# Patient Record
Sex: Female | Born: 2000 | Race: Black or African American | Hispanic: No | Marital: Single | Smoking: Never smoker
Health system: Southern US, Community
[De-identification: ages and names within clinical notes are randomized; demographics above are authoritative.]

---

## 2019-06-28 ENCOUNTER — Encounter (HOSPITAL_COMMUNITY): Payer: Self-pay | Admitting: Emergency Medicine

## 2019-06-28 ENCOUNTER — Other Ambulatory Visit: Payer: Self-pay

## 2019-06-28 ENCOUNTER — Inpatient Hospital Stay (HOSPITAL_COMMUNITY)
Admission: AD | Admit: 2019-06-28 | Payer: No Typology Code available for payment source | Source: Intra-hospital | Admitting: Psychiatry

## 2019-06-28 ENCOUNTER — Emergency Department (HOSPITAL_COMMUNITY)
Admission: EM | Admit: 2019-06-28 | Discharge: 2019-06-29 | Disposition: A | Discharge status: Other Acute Inpatient Hospital | Payer: No Typology Code available for payment source | Attending: Emergency Medicine | Admitting: Emergency Medicine

## 2019-06-28 DIAGNOSIS — Z20822 Contact with and (suspected) exposure to covid-19: Secondary | ICD-10-CM | POA: Insufficient documentation

## 2019-06-28 DIAGNOSIS — Z046 Encounter for general psychiatric examination, requested by authority: Secondary | ICD-10-CM

## 2019-06-28 DIAGNOSIS — F23 Brief psychotic disorder: Secondary | ICD-10-CM | POA: Insufficient documentation

## 2019-06-28 LAB — COMPREHENSIVE METABOLIC PANEL
ALT: 13 U/L (ref 0–44)
AST: 17 U/L (ref 15–41)
Albumin: 4.5 g/dL (ref 3.5–5.0)
Alkaline Phosphatase: 49 U/L (ref 38–126)
Anion gap: 10 (ref 5–15)
BUN: 7 mg/dL (ref 6–20)
CO2: 22 mmol/L (ref 22–32)
Calcium: 9.6 mg/dL (ref 8.9–10.3)
Chloride: 105 mmol/L (ref 98–111)
Creatinine, Ser: 0.62 mg/dL (ref 0.44–1.00)
GFR calc Af Amer: >60 mL/min (ref >60)
GFR calc non Af Amer: >60 mL/min (ref >60)
Glucose, Bld: 103 mg/dL — ABNORMAL HIGH (ref 70–99)
Potassium: 4.4 mmol/L (ref 3.5–5.1)
Sodium: 137 mmol/L (ref 135–145)
Total Bilirubin: 0.5 mg/dL (ref 0.3–1.2)
Total Protein: 8.3 g/dL — ABNORMAL HIGH (ref 6.5–8.1)

## 2019-06-28 LAB — I-STAT BETA HCG BLOOD, ED (MC, WL, AP ONLY): I-stat hCG, quantitative: <5 m[IU]/mL (ref <5)

## 2019-06-28 LAB — CBC
HCT: 39.6 % (ref 36.0–46.0)
Hemoglobin: 13.1 g/dL (ref 12.0–15.0)
MCH: 28.4 pg (ref 26.0–34.0)
MCHC: 33.1 g/dL (ref 30.0–36.0)
MCV: 85.7 fL (ref 80.0–100.0)
Platelets: 307 10*3/uL (ref 150–400)
RBC: 4.62 MIL/uL (ref 3.87–5.11)
RDW: 12.8 % (ref 11.5–15.5)
WBC: 8.8 10*3/uL (ref 4.0–10.5)
nRBC: 0 % (ref 0.0–0.2)

## 2019-06-28 LAB — RAPID URINE DRUG SCREEN, HOSP PERFORMED
Amphetamines: NOT DETECTED
Barbiturates: NOT DETECTED
Benzodiazepines: NOT DETECTED
Cocaine: NOT DETECTED
Opiates: NOT DETECTED
Tetrahydrocannabinol: POSITIVE — AB

## 2019-06-28 LAB — SARS CORONAVIRUS 2 BY RT PCR (HOSPITAL ORDER, PERFORMED IN ~~LOC~~ HOSPITAL LAB): SARS Coronavirus 2: NEGATIVE

## 2019-06-28 LAB — ETHANOL: Alcohol, Ethyl (B): <10 mg/dL (ref <10)

## 2019-06-28 MED ORDER — DIPHENHYDRAMINE HCL 50 MG/ML IJ SOLN: 50.0000 mg | Freq: Once | INTRAMUSCULAR | Status: DC

## 2019-06-28 MED ORDER — DIPHENHYDRAMINE HCL 50 MG/ML IJ SOLN: 50.0000 mg | Freq: Once | INTRAMUSCULAR | Status: DC | PRN

## 2019-06-28 MED ORDER — ZOLPIDEM TARTRATE 5 MG PO TABS: 5.0000 mg | ORAL_TABLET | Freq: Every evening | ORAL | Status: DC | PRN

## 2019-06-28 MED ORDER — ONDANSETRON HCL 4 MG PO TABS: 4.0000 mg | ORAL_TABLET | Freq: Three times a day (TID) | ORAL | Status: DC | PRN

## 2019-06-28 MED ORDER — ACETAMINOPHEN 325 MG PO TABS: 650.0000 mg | ORAL_TABLET | ORAL | Status: DC | PRN

## 2019-06-28 MED ORDER — OLANZAPINE 10 MG IM SOLR
10.0000 mg | Freq: Once | INTRAMUSCULAR | Status: AC | PRN
  Administered 2019-06-29: 10 mg via INTRAMUSCULAR
  Filled 2019-06-28: qty 10

## 2019-06-28 MED ORDER — ALUM & MAG HYDROXIDE-SIMETH 200-200-20 MG/5ML PO SUSP: 30.0000 mL | Freq: Four times a day (QID) | ORAL | Status: DC | PRN

## 2019-06-28 NOTE — ED Notes (Signed)
PT'S ID IS IN HER BELONGING'S BAG THAT IS IN LOCK[address].  HER SISTER TOOK HER TELEPHONE WHEN SHE LEFT.
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
## 2019-06-28 NOTE — ED Notes (Signed)
Pt's sister has dropped her off here and left with pt's telephone so we do not have any telephone numbers for family.  The number that we have for her mother is not being answered.  Pt is agitated about her circumstance but does redirect.
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
## 2019-06-28 NOTE — ED Provider Notes (Signed)
[facility]-EMERGENCY DEPT Provider Note   CSN: [id number] Arrival date & time: 06/28/19  1135     History Chief Complaint  Patient presents with  . Hallucinations    Olivia Juarez is a 19 y.o. female presents brought in by her sister for evaluation of increasing paranoia.  When asked what brought her into the emergency department patient states "my sister because she thinks I am crazy.  But she does not know that her phones are tapped and the voices that I am hearing are coming through the phone".  She states that she has had similar experiences when living in [facility].  She denies suicidal ideation or homicidal ideation.  She states that she smokes marijuana "once in a blue moon", denies other recreational drug use or cigarette smoking or alcohol use.  She denies suicidal ideation or homicidal ideation.  She denies any medical complaints otherwise.  The history is provided by the patient.       History reviewed. No pertinent past medical history.  There are no problems to display for this patient.   History reviewed. No pertinent surgical history.   OB History   No obstetric history on file.     No family history on file.  Social History   Tobacco Use  . Smoking status: Never Smoker  . Smokeless tobacco: Never Used  Substance Use Topics  . Alcohol use: Never  . Drug use: Never    Home Medications Prior to Admission medications   Not on File    Allergies    Patient has no known allergies.  Review of Systems   Review of Systems  Constitutional: Negative for chills and fever.  Psychiatric/Behavioral: Positive for hallucinations. The patient is nervous/anxious.   All other systems reviewed and are negative.   Physical Exam Updated Vital Signs BP (!) 154/102 (BP Location: Left Arm)   Pulse 85   Temp 98.3 F (36.8 C) (Oral)   Resp 18   Ht 5\' 2"  (1.575 m)   Wt 69.4 kg   SpO2 100%   BMI 27.98 kg/m   Physical  Exam Vitals and nursing note reviewed.  Constitutional:      General: She is not in acute distress.    Appearance: She is well-developed.  HENT:     Head: Normocephalic and atraumatic.  Eyes:     General:        Right eye: No discharge.        Left eye: No discharge.     Conjunctiva/sclera: Conjunctivae normal.  Neck:     Vascular: No JVD.     Trachea: No tracheal deviation.  Cardiovascular:     Rate and Rhythm: Normal rate and regular rhythm.  Pulmonary:     Effort: Pulmonary effort is normal.     Breath sounds: Normal breath sounds.  Abdominal:     General: Bowel sounds are normal. There is no distension.     Palpations: Abdomen is soft.     Tenderness: There is no abdominal tenderness. There is no guarding or rebound.  Skin:    General: Skin is warm and dry.     Findings: No erythema.  Neurological:     Mental Status: She is alert.  Psychiatric:        Mood and Affect: Mood is anxious.        Behavior: Behavior is agitated. Behavior is cooperative.        Thought Content: Thought content is paranoid. Thought  content does not include homicidal or suicidal ideation. Thought content does not include homicidal or suicidal plan.     ED Results / Procedures / Treatments   Labs (all labs ordered are listed, but only abnormal results are displayed) Labs Reviewed  COMPREHENSIVE METABOLIC PANEL - Abnormal; Notable for the following components:      Result Value   Glucose, Bld 103 (*)    Total Protein 8.3 (*)    All other components within normal limits  RAPID URINE DRUG SCREEN, HOSP PERFORMED - Abnormal; Notable for the following components:   Tetrahydrocannabinol POSITIVE (*)    All other components within normal limits  SARS CORONAVIRUS 2 BY RT PCR (HOSPITAL ORDER, [place] [facility])  ETHANOL  CBC  I-STAT BETA HCG BLOOD, ED ([facility], [facility], [facility] ONLY)    EKG None  Radiology No results found.  Procedures Procedures (including critical care time)   Medications Ordered in ED Medications  acetaminophen (TYLENOL) tablet 650 mg (has no administration in time range)  ondansetron (ZOFRAN) tablet 4 mg (has no administration in time range)  alum & mag hydroxide-simeth (MAALOX/MYLANTA) 200-200-20 MG/5ML suspension 30 mL (has no administration in time range)  OLANZapine (ZYPREXA) injection 10 mg (has no administration in time range)  diphenhydrAMINE (BENADRYL) injection 50 mg (50 mg Intramuscular Refused 06/28/19 1833)  diphenhydrAMINE (BENADRYL) injection 50 mg (has no administration in time range)    ED Course  I have reviewed the triage vital signs and the nursing notes.  Pertinent labs & imaging results that were available during my care of the patient were reviewed by me and considered in my medical decision making (see chart for details).    MDM Rules/Calculators/A&P                      Patient presenting brought in by sister for evaluation of paranoia and delusions.  She is concerned that someone has bugged her telephone and was following her.  She denies suicidal ideation or homicidal ideation.  She has reportedly been talking to herself.  She is afebrile, initially tachycardic and mildly hypertensive with improvement on subsequent reevaluations.  Physical examination is reassuring and screening labs reviewed and interpreted by myself show no concerning findings.  Her UDS is positive for THC and she admits to smoking marijuana occasionally.  She is medically cleared for TTS evaluation at this time.  TTS recommends inpatient admission.  Patient has attempted to leave twice now.  She will need to be placed under involuntary commitment due to concern that she is a danger to herself at this time.  Final Clinical Impression(s) / ED Diagnoses Final diagnoses:  Brief psychotic disorder [facility])  Involuntary commitment    Rx / DC Orders ED Discharge Orders    None       Debroah Baller 06/28/19 2056    Varney Biles, MD  06/29/19 [phone]

## 2019-06-28 NOTE — ED Notes (Signed)
Pt's mother's name and telephone number (according to pt)  Is Sabrina at [phone].  Pt was in her room talking to herself in such a robust manner that this writer thought that she was on the telephone with her mother.

## 2019-06-28 NOTE — ED Triage Notes (Signed)
Patient here from home brought in by sister complaining of increase paranoia. Patient does not have a hx and is not on any meds. Patient recently begin thinking that neighbors were following her around and talking to her through the vents. Also believes neighbors have gained control for her phone and are monitoring her through it.

## 2019-06-28 NOTE — ED Notes (Signed)
Pt dressed in hospital scrubs and wanded by security. Allowed to keep her hair on which is attached with a head band. Pt denies suicide.  Report is that she is delusional. Pt said, "Just because I said that my phone is tapped.  It is tapped!!"  When asked who is tapping it she said, "I'm tired of answering these questions.

## 2019-06-29 ENCOUNTER — Encounter (HOSPITAL_COMMUNITY): Payer: Self-pay | Admitting: Psychiatry

## 2019-06-29 ENCOUNTER — Other Ambulatory Visit: Payer: Self-pay

## 2019-06-29 ENCOUNTER — Encounter (HOSPITAL_COMMUNITY): Payer: Self-pay | Admitting: Registered Nurse

## 2019-06-29 ENCOUNTER — Emergency Department (HOSPITAL_COMMUNITY): Payer: No Typology Code available for payment source

## 2019-06-29 ENCOUNTER — Inpatient Hospital Stay (HOSPITAL_COMMUNITY)
Admission: AD | Admit: 2019-06-29 | Discharge: 2019-07-02 | DRG: 885 | Disposition: A | Discharge status: Home / Self Care | Payer: No Typology Code available for payment source | Source: Intra-hospital | Attending: Psychiatry | Admitting: Psychiatry

## 2019-06-29 DIAGNOSIS — Z91040 Latex allergy status: Secondary | ICD-10-CM | POA: Diagnosis not present

## 2019-06-29 DIAGNOSIS — F909 Attention-deficit hyperactivity disorder, unspecified type: Secondary | ICD-10-CM | POA: Diagnosis present

## 2019-06-29 DIAGNOSIS — K649 Unspecified hemorrhoids: Secondary | ICD-10-CM | POA: Diagnosis present

## 2019-06-29 DIAGNOSIS — G4700 Insomnia, unspecified: Secondary | ICD-10-CM | POA: Diagnosis present

## 2019-06-29 DIAGNOSIS — F1210 Cannabis abuse, uncomplicated: Secondary | ICD-10-CM | POA: Diagnosis present

## 2019-06-29 DIAGNOSIS — B379 Candidiasis, unspecified: Secondary | ICD-10-CM | POA: Diagnosis present

## 2019-06-29 DIAGNOSIS — K5900 Constipation, unspecified: Secondary | ICD-10-CM | POA: Diagnosis present

## 2019-06-29 DIAGNOSIS — F23 Brief psychotic disorder: Secondary | ICD-10-CM | POA: Diagnosis present

## 2019-06-29 DIAGNOSIS — F29 Unspecified psychosis not due to a substance or known physiological condition: Secondary | ICD-10-CM | POA: Diagnosis present

## 2019-06-29 DIAGNOSIS — F419 Anxiety disorder, unspecified: Secondary | ICD-10-CM | POA: Diagnosis present

## 2019-06-29 LAB — LIPID PANEL
Cholesterol: 158 mg/dL (ref 0–200)
HDL: 59 mg/dL (ref >40)
LDL Cholesterol: 89 mg/dL (ref 0–99)
Total CHOL/HDL Ratio: 2.7 RATIO
Triglycerides: 49 mg/dL (ref <150)
VLDL: 10 mg/dL (ref 0–40)

## 2019-06-29 LAB — TSH: TSH: 1.418 u[IU]/mL (ref 0.350–4.500)

## 2019-06-29 MED ORDER — MAGNESIUM HYDROXIDE 400 MG/5ML PO SUSP
30.0000 mL | Freq: Every day | ORAL | Status: DC | PRN
  Administered 2019-06-29 – 2019-06-30 (×2): 30 mL via ORAL

## 2019-06-29 MED ORDER — ZIPRASIDONE MESYLATE 20 MG IM SOLR
20.0000 mg | INTRAMUSCULAR | Status: AC | PRN
  Administered 2019-06-29: 20 mg via INTRAMUSCULAR
  Filled 2019-06-29: qty 20

## 2019-06-29 MED ORDER — ACETAMINOPHEN 325 MG PO TABS
650.0000 mg | ORAL_TABLET | Freq: Four times a day (QID) | ORAL | Status: DC | PRN
  Administered 2019-06-30 – 2019-07-02 (×3): 650 mg via ORAL
  Filled 2019-06-29 (×3): qty 2

## 2019-06-29 MED ORDER — OLANZAPINE 5 MG PO TBDP
5.0000 mg | ORAL_TABLET | Freq: Three times a day (TID) | ORAL | Status: DC | PRN
  Filled 2019-06-29: qty 1

## 2019-06-29 MED ORDER — HYDROXYZINE HCL 25 MG PO TABS
25.0000 mg | ORAL_TABLET | Freq: Three times a day (TID) | ORAL | Status: DC | PRN
  Administered 2019-06-30 – 2019-07-02 (×3): 25 mg via ORAL
  Filled 2019-06-29 (×5): qty 1

## 2019-06-29 MED ORDER — TRAZODONE HCL 100 MG PO TABS
100.0000 mg | ORAL_TABLET | Freq: Every evening | ORAL | Status: DC | PRN
  Administered 2019-06-29: 100 mg via ORAL
  Filled 2019-06-29 (×2): qty 1

## 2019-06-29 MED ORDER — OLANZAPINE 5 MG PO TBDP
5.0000 mg | ORAL_TABLET | Freq: Every day | ORAL | Status: DC
  Administered 2019-06-29: 5 mg via ORAL
  Filled 2019-06-29: qty 1

## 2019-06-29 MED ORDER — ALUM & MAG HYDROXIDE-SIMETH 200-200-20 MG/5ML PO SUSP: 30.0000 mL | ORAL | Status: DC | PRN

## 2019-06-29 MED ORDER — OLANZAPINE 5 MG PO TBDP: 5.0000 mg | ORAL_TABLET | Freq: Every day | ORAL | Status: DC

## 2019-06-29 MED ORDER — STERILE WATER FOR INJECTION IJ SOLN
INTRAMUSCULAR | Status: AC
  Administered 2019-06-29: 10 mL
  Filled 2019-06-29: qty 10

## 2019-06-29 MED ORDER — LORAZEPAM 1 MG PO TABS: 1.0000 mg | ORAL_TABLET | ORAL | Status: DC | PRN

## 2019-06-29 MED ORDER — DIPHENHYDRAMINE HCL 25 MG PO CAPS
25.0000 mg | ORAL_CAPSULE | Freq: Once | ORAL | Status: AC
  Administered 2019-06-29: 25 mg via ORAL
  Filled 2019-06-29: qty 1
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
## 2019-06-29 NOTE — Discharge Summary (Signed)
  Olivia Juarez, 19 y.o., female patient seen via tele psych by this provider, Dr. Jola Babinski; and chart reviewed on 06/29/19.  Patient admitted to ED under IVC by her sister. Collateral with patients sister informed that patient has been living with her for last month and noticed patient talking to herself and worsening paranoia that she was being watched by neighbors, recordings in wall and telephone tapped.  Sister also informed that patient did smoke marijuana and it may have been laced with something.  On evaluation Olivia Juarez reports she is at the hospital because of her sister.  Patient states that she is living with her sister, has no children, and that she is employed.  Denies prior psychiatric history inpatient and outpatient.  States she has never taken any psychotropic medications.  Patient also denies illicit drug use; when asked about marijuana, she made a comment but was to low to understand what she was saying.  Patient denies all complaints of IVC.  Spoke with patients nurse who informs that patient has been sleeping all morning but was given Zyprexa last night for agitation.   During evaluation Olivia Juarez is sitting upright on bed; she alert/oriented x 2 (aware self and that she is in hospital but not why); calm/cooperative after being awakened for psychiatric assessment; and mood is depressed which is congruent with affect.  Patient is speaking in a clear tone at very low volume, and normal pace; with fair eye contact.  Patient does appear to have some confusion or disorganization.  Difficult to hear her answers to questions related to speaking in low volume.    Her thought process is confused/disorganized.  Unable to tell at this time if patient is responding to internal/external stimuli; but she does appear to be paranoid; although she denies suicidal/self-harm/homicidal ideation, psychosis, and paranoia.    This is patients first psychotic episode.  Ordered TSH, Lipid panel, EKG, and  CT Head.  Started Zyprexa 5 mg Q hs for brief psychotic disorder  Recommended inpatient psychiatric treatment and has been accepted to [facility].  Patient is to be transferred after labs and scans completed.    Disposition: Recommend psychiatric Inpatient admission when medically cleared.

## 2019-06-29 NOTE — Progress Notes (Signed)
Shoelaces placed in her locker.

## 2019-06-29 NOTE — Tx Team (Signed)
Initial Treatment Plan 06/29/2019 5:36 PM Olivia Juarez Olivia Juarez [id number]    PATIENT STRESSORS: Medication change or noncompliance Substance abuse   PATIENT STRENGTHS: Capable of independent living Communication skills Motivation for treatment/growth Physical Health Religious Affiliation Supportive family/friends Work skills   PATIENT IDENTIFIED PROBLEMS: Alterations in thought process (psychosis) "I'm still hearing voices right now and it scares me".    Alterations in mood (Anxiety & Depression) "I am worried about being here and what is going to happen".                 DISCHARGE CRITERIA:  Improved stabilization in mood, thinking, and/or behavior Verbal commitment to aftercare and medication compliance  PRELIMINARY DISCHARGE PLAN: Outpatient therapy Return to previous living arrangement Return to previous work or school arrangements  PATIENT/FAMILY INVOLVEMENT: This treatment plan has been presented to and reviewed with the patient, Olivia Juarez.  The patient have been given the opportunity to ask questions and make suggestions.  Sherryl Manges, RN 06/29/2019, 5:36 PM

## 2019-06-29 NOTE — ED Notes (Signed)
Attempted to call report but no answer on the unit.

## 2019-06-29 NOTE — Progress Notes (Signed)
Pt up to the nursing station complaining of AH

## 2019-06-29 NOTE — BH Assessment (Addendum)
BHH Assessment Progress Note  Per Shuvon Rankin, FNP, this pt requires psychiatric hospitalization.  Jasmine has assigned pt to [facility] Rm 503-2.  Pt presents under IVC initiated by EDP Christiana Pellant, MD, and IVC documents have been sent to [facility].  Pt's nurse, Kendal Hymen, has been notified, and agrees to call report to [phone].  Pt is to be transported via [facility].   Doylene Canning, [place] Behavioral Health Coordinator [phone]   Addendum:  Per Leavy Cella, [facility] will be ready to receive pt at 14:00.  Kendal Hymen has been notified.  Doylene Canning, [place] Behavioral Health Coordinator [phone]

## 2019-06-29 NOTE — Progress Notes (Signed)
Received Olivia Juarez this PM awake in her room with the sitter at the bedside.She is restless and stated hearing music. She took her clothes off and wrapped herself in a sheet, shortly thereafter she put her clothes back on. She was given crayons and activity sheets to keep busy until she felt tired and drift off to sleep. She slept for approximently one hour after receiving the PO Benadryl, then she was once again focused on the loud music and could not be redirected. She received Zyprexa 10 mg for agitation and she was able to drift off to sleep.

## 2019-06-29 NOTE — Progress Notes (Signed)
Pt is a  19 y/o [place] female transferred to [facility] from [facility] for continuation of care. Pt A & O X3. Denies SI, HI, VH and pain when assessed. Continue to endorse positive auditory hallucinations "I still hear the voices, even right now and it scares me". States she recently moved to [place] to live with her sister "because I wanted something better for myself" from [place] where she lived with her grandmother. 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 Reports poor sleep about 3-4 hours a night with fair appetite, smokes THC and drinks occasionally "I smoke last week, I drank 1 shot of brown liquor about 3 weeks ago from my sister". Denies history of abuse and mental illness in her family. Skin assessment done, one tattoo noted to mid chest. Belongings searched, items deemed contraband secured in locker. Unit orientation done, routines discussed and care plan reviewed with pt; understanding verbalized. Tolerates all PO intake well when offered. Emotional support and encouragement offered to pt. Q 15 minutes safety checks initiated for safety with self harm gestures or outburst.

## 2019-06-29 NOTE — Progress Notes (Signed)
Pt paranoid about being on the unit, pt stated she thought "my blunt was laced, but it came back clear" pt was informed that a lot of substances THC is laced with do not show up on regular drug test    06/29/19 2000  Psych Admission Type (Psych Patients Only)  Admission Status Involuntary  Psychosocial Assessment  Patient Complaints Anxiety  Eye Contact Fair;Suspiciousness  Facial Expression Flat;Sad;Worried  Affect Appropriate to circumstance  Speech Logical/coherent;Soft  Interaction Guarded;Childlike  Motor Activity Other (Comment) (WNL)  Appearance/Hygiene In scrubs  Behavior Characteristics Anxious  Mood Suspicious;Anxious  Thought Process  Coherency Circumstantial  Content Blaming others  Delusions None reported or observed  Perception WDL  Hallucination Auditory  Judgment Limited  Confusion None  Danger to Self  Current suicidal ideation? Denies  Danger to Others  Danger to Others None reported or observed
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
## 2019-06-29 NOTE — Progress Notes (Signed)
Pt given Geodon per MAR due to pt agitation and complaints of voices in her head

## 2019-06-30 DIAGNOSIS — F23 Brief psychotic disorder: Principal | ICD-10-CM

## 2019-06-30 MED ORDER — OLANZAPINE 10 MG PO TBDP
10.0000 mg | ORAL_TABLET | Freq: Every day | ORAL | Status: DC
  Administered 2019-06-30 – 2019-07-01 (×2): 10 mg via ORAL
  Filled 2019-06-30 (×3): qty 1

## 2019-06-30 MED ORDER — OLANZAPINE 10 MG PO TBDP
20.0000 mg | ORAL_TABLET | Freq: Every day | ORAL | Status: DC
  Administered 2019-06-30 – 2019-07-01 (×2): 20 mg via ORAL
  Filled 2019-06-30 (×5): qty 2

## 2019-06-30 MED ORDER — OLANZAPINE 10 MG PO TBDP: 10.0000 mg | ORAL_TABLET | Freq: Three times a day (TID) | ORAL | Status: DC | PRN

## 2019-06-30 MED ORDER — ZIPRASIDONE MESYLATE 20 MG IM SOLR: 20.0000 mg | Freq: Four times a day (QID) | INTRAMUSCULAR | Status: DC | PRN

## 2019-06-30 MED ORDER — LORAZEPAM 1 MG PO TABS: 2.0000 mg | ORAL_TABLET | ORAL | Status: DC | PRN

## 2019-06-30 MED ORDER — OLANZAPINE 5 MG PO TBDP
15.0000 mg | ORAL_TABLET | Freq: Every day | ORAL | Status: DC
  Filled 2019-06-30 (×2): qty 1
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
## 2019-06-30 NOTE — [facility] Suicide Risk Assessment (Signed)
[facility] INPATIENT:  Family/Significant Other Suicide Prevention Education  Suicide Prevention Education: Education Completed; Sister, Olivia Juarez [phone]), has been identified by the patient as the family member/significant other with whom the patient will be residing, and identified as the person(s) who will aid the patient in the event of a mental health crisis (suicidal ideations/suicide attempt).  With written consent from the patient, the family member/significant other has been provided the following suicide prevention education, prior to the and/or following the discharge of the patient.  The suicide prevention education provided includes the following:  Suicide risk factors  Suicide prevention and interventions  National Suicide Hotline telephone number  [facility] assessment telephone number  [facility] Emergency Assistance 911  [facility] and/or Residential Mobile Crisis Unit telephone number   Request made of family/significant other to:  Remove weapons (e.g., guns, rifles, knives), all items previously/currently identified as safety concern.    Remove drugs/medications (over-the-counter, prescriptions, illicit drugs), all items previously/currently identified as a safety concern.   The family member/significant other verbalizes understanding of the suicide prevention education information provided.  The family member/significant other agrees to remove the items of safety concern listed above.  Per patients sister pt began to become paranoid and have auditory hallucinations a little bit over a week ago. [name] reports that her sister has never had any previous psychiatric symptoms. Patients sister stated that this patient is able to return home to live with her once discharged and stated that all weapons in the house are secured.  Ruthann Cancer MSW, [employer] Clincal Social Worker  [facility]

## 2019-06-30 NOTE — Progress Notes (Signed)
Pt continues to be paranoid, pt worried about people coming in her room at night. Pt reassured that staff will be watching her room all night.
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
## 2019-06-30 NOTE — Progress Notes (Signed)
Patient dropped vistaril on floor and had to pull another pill out of pyxis. Vistaril 25 mg

## 2019-06-30 NOTE — Progress Notes (Signed)
Pt was up and down much of the evening disturbing her roommate

## 2019-06-30 NOTE — BHH Suicide Risk Assessment (Signed)
[facility] Admission Suicide Risk Assessment   Nursing information obtained from:  Patient Demographic factors:  Adolescent or young adult, Low socioeconomic status Current Mental Status:  NA Loss Factors:  NA Historical Factors:  NA Risk Reduction Factors:  Sense of responsibility to family, Religious beliefs about death, Employed, Living with another person, especially a relative, Positive social support  Total Time spent with patient: 30 minutes Principal Problem: <principal problem not specified> Diagnosis:  Active Problems:   Brief psychotic disorder ([place])   Psychosis ([place])  Subjective Data: Patient is seen and examined.  Patient is a 19 year old female with a past psychiatric history significant for possible attention deficit hyperactivity disorder who presented to the [facility] emergency department on 06/28/2019 after being brought there by her sister.  The patient moved from [place] approximately 3 weeks prior to admission.  She was going to live with her sister.  The patient stated this a.m. that she had moved from [place] to get away from her mother.  She stated that she and her mother with fight for unspecified reasons.  According to the notes in the emergency room sister reported that things have not gone well since she had moved here.  The patient had reportedly thought that the neighbors were looking through their blinds, that the neighbors were recording her, and that her sisters bones were tapped.  The patient is significantly sedated this morning.  She was unable to sleep at all last night.  She stated that she was hearing voices currently, but the voices were in her head.  She stated that the voices started approximately 3 weeks ago.  She denied any alcohol or drug use outside of marijuana use.  She stated the last time she used marijuana was approximately 3 weeks ago.  She had been previously prescribed Vyvanse several years ago according to the PMP database.   Her drug screen was negative except for marijuana.  The notes from the emergency room stated that she had smoked marijuana on 5/27.  He denies any suicidal or homicidal ideation.  She was admitted to the hospital for evaluation and stabilization.  Continued Clinical Symptoms:  Alcohol Use Disorder Identification Test Final Score (AUDIT): 1 The "Alcohol Use Disorders Identification Test", Guidelines for Use in Primary Care, Second Edition.  [facility]). Score between 0-7:  no or low risk or alcohol related problems. Score between 8-15:  moderate risk of alcohol related problems. Score between 16-19:  high risk of alcohol related problems. Score 20 or above:  warrants further diagnostic evaluation for alcohol dependence and treatment.   CLINICAL FACTORS:   Alcohol/Substance Abuse/Dependencies Currently Psychotic   Musculoskeletal: Strength & Muscle Tone: within normal limits Gait & Station: unsteady Patient leans: N/A  Psychiatric Specialty Exam: Physical Exam  Nursing note and vitals reviewed. Constitutional: She is oriented to person, place, and time. She appears well-developed and well-nourished.  HENT:  Head: Normocephalic and atraumatic.  Respiratory: Effort normal.  Neurological: She is alert and oriented to person, place, and time.    Review of Systems  Blood pressure 106/68, pulse 94, temperature 99.1 F (37.3 C), temperature source Oral, resp. rate 16, height 5\' 2"  (1.575 m), weight 69.4 kg, SpO2 100 %.Body mass index is 27.98 kg/m.  General Appearance: Disheveled  Eye Contact:  Minimal  Speech:  Slow  Volume:  Decreased  Mood:  Sedated  Affect:  Congruent  Thought Process:  Disorganized and Descriptions of Associations: Loose  Orientation:  Full (Time, Place, and  Person)  Thought Content:  Hallucinations: Auditory  Suicidal Thoughts:  No  Homicidal Thoughts:  No  Memory:  Immediate;   Poor Recent;   Poor Remote;   Poor  Judgement:  Impaired   Insight:  Fair  Psychomotor Activity:  Decreased  Concentration:  Concentration: Poor and Attention Span: Poor  Recall:  Poor  Fund of Knowledge:  Fair  Language:  Fair  Akathisia:  Negative  Handed:  Right  AIMS (if indicated):     Assets:  Desire for Improvement Housing Resilience Social Support  ADL's:  Impaired  Cognition:  WNL  Sleep:  Number of Hours: 5.25      COGNITIVE FEATURES THAT CONTRIBUTE TO RISK:  None    SUICIDE RISK:   Minimal: No identifiable suicidal ideation.  Patients presenting with no risk factors but with morbid ruminations; may be classified as minimal risk based on the severity of the depressive symptoms  PLAN OF CARE: Patient is seen and examined.  Patient is a 19 year old female with new onset psychosis who was admitted to the psychiatric hospital for evaluation and stabilization.  She will be admitted to the hospital.  She will be integrated into the milieu.  She will be encouraged to attend groups.  On admission she was started on Zyprexa 5 mg p.o. twice daily.  She was unable to sleep last night and she continues to have auditory hallucinations.  She is sedated currently, but to get her psychosis under control we will increase her olanzapine to 10 mg p.o. daily 50 mg p.o. nightly.  If she becomes oversedated we will reduce that medication.  Her mental status examination at this point is unclear secondary to sedation.  Hopefully that will clear during the course of hospitalization.  Review of her admission laboratories revealed a mildly elevated glucose at 103.  Her lipids were normal.  Her CBC was normal.  Her beta-hCG was less than 5.  TSH was 1.418.  Drug screen was positive for marijuana.  CT scan of the head was essentially normal.  EKG showed a normal sinus rhythm with a QTc interval that was normal.  Urinalysis was not obtained, but we will obtain that today.  We will also contact her family for collateral information.  I certify that inpatient  services furnished can reasonably be expected to improve the patient's condition.   Antonieta Pert, MD 06/30/2019, 7:38 AM

## 2019-06-30 NOTE — Progress Notes (Signed)
Pt stated she was feeling better due to the medications. Pt AH less    06/30/19 2000  Psych Admission Type (Psych Patients Only)  Admission Status Involuntary  Psychosocial Assessment  Patient Complaints Anxiety  Eye Contact Fair;Suspiciousness  Facial Expression Flat;Sad;Worried  Affect Appropriate to circumstance  Speech Logical/coherent;Soft  Interaction Guarded;Childlike  Motor Activity Other (Comment) (WNL)  Appearance/Hygiene In scrubs  Behavior Characteristics Anxious  Mood Suspicious;Preoccupied  Thought Process  Coherency Circumstantial  Content Blaming others  Delusions None reported or observed  Perception WDL  Hallucination Auditory  Judgment Limited  Confusion None  Danger to Self  Current suicidal ideation? Denies  Danger to Others  Danger to Others None reported or observed

## 2019-07-01 MED ORDER — MAGNESIUM CITRATE PO SOLN
1.0000 | Freq: Once | ORAL | Status: DC | PRN
  Filled 2019-07-01: qty 296

## 2019-07-01 MED ORDER — METRONIDAZOLE 500 MG PO TABS
500.0000 mg | ORAL_TABLET | Freq: Two times a day (BID) | ORAL | Status: DC
  Administered 2019-07-01 – 2019-07-02 (×2): 500 mg via ORAL
  Filled 2019-07-01 (×6): qty 1

## 2019-07-01 MED ORDER — AZITHROMYCIN 250 MG PO TABS
1000.0000 mg | ORAL_TABLET | Freq: Once | ORAL | Status: AC
  Administered 2019-07-01: 1000 mg via ORAL
  Filled 2019-07-01: qty 4

## 2019-07-01 MED ORDER — CEFTRIAXONE SODIUM 250 MG IJ SOLR
250.0000 mg | Freq: Once | INTRAMUSCULAR | Status: AC
  Administered 2019-07-01: 250 mg via INTRAMUSCULAR
  Filled 2019-07-01: qty 250

## 2019-07-01 MED ORDER — HYDROCORTISONE (PERIANAL) 2.5 % EX CREA
TOPICAL_CREAM | Freq: Three times a day (TID) | CUTANEOUS | Status: DC
  Administered 2019-07-01: 1 via RECTAL
  Filled 2019-07-01: qty 28.35

## 2019-07-01 MED ORDER — BISACODYL 10 MG RE SUPP: 10.0000 mg | Freq: Every day | RECTAL | Status: DC | PRN

## 2019-07-01 MED ORDER — HYDROCORTISONE 1 % EX CREA
TOPICAL_CREAM | Freq: Three times a day (TID) | CUTANEOUS | Status: DC
  Filled 2019-07-01: qty 28

## 2019-07-01 NOTE — Plan of Care (Signed)
Progress note  D: pt found in bed; compliant with medication administration. Pt seems paranoid and is minimal on approach. Pt is soft spoken and reclusive. Pt seems scattered in their thought process. Pt denies si/hi/ah/vh and verbally agrees to approach staff if these become apparent or before harming themself/others while at bhh.  A: Pt provided support and encouragement. Pt given medication per protocol and standing orders. Q63m safety checks implemented and continued.  R: Pt safe on the unit. Will continue to monitor.  Pt progressing in the following metrics  Problem: Education: Goal: Knowledge of [facility] Education information/materials will improve Outcome: Progressing Goal: Emotional status will improve Outcome: Progressing Goal: Mental status will improve Outcome: Progressing Goal: Verbalization of understanding the information provided will improve Outcome: Progressing

## 2019-07-01 NOTE — BHH Group Notes (Signed)
The focus of this group is to help patients establish daily goals to achieve during treatment and discuss how the patient can incorporate goal setting into their daily lives to aide in recovery.  Pt attended and participated in group 

## 2019-07-01 NOTE — Tx Team (Signed)
Interdisciplinary Treatment and Diagnostic Plan Update  07/01/2019 Time of Session: 9:00am Olivia Juarez MRN: [id number]  Principal Diagnosis: <principal problem not specified>  Secondary Diagnoses: Active Problems:   Brief psychotic disorder ([place])   Psychosis ([place])   Current Medications:  Current Facility-Administered Medications  Medication Dose Route Frequency Provider Last Rate Last Admin  . acetaminophen (TYLENOL) tablet 650 mg  650 mg Oral Q6H PRN Sharma Covert, MD   650 mg at 06/30/19 1847  . alum & mag hydroxide-simeth (MAALOX/MYLANTA) 200-200-20 MG/5ML suspension 30 mL  30 mL Oral Q4H PRN Sharma Covert, MD      . hydrOXYzine (ATARAX/VISTARIL) tablet 25 mg  25 mg Oral TID PRN Sharma Covert, MD   25 mg at 06/30/19 2110  . OLANZapine zydis (ZYPREXA) disintegrating tablet 10 mg  10 mg Oral Q8H PRN Sharma Covert, MD       And  . LORazepam (ATIVAN) tablet 2 mg  2 mg Oral PRN Sharma Covert, MD      . magnesium hydroxide (MILK OF MAGNESIA) suspension 30 mL  30 mL Oral Daily PRN Sharma Covert, MD   30 mL at 06/30/19 1921  . OLANZapine zydis (ZYPREXA) disintegrating tablet 10 mg  10 mg Oral Daily Sharma Covert, MD   10 mg at 07/01/19 0741  . OLANZapine zydis (ZYPREXA) disintegrating tablet 20 mg  20 mg Oral QHS Sharma Covert, MD   20 mg at 06/30/19 2110  . traZODone (DESYREL) tablet 100 mg  100 mg Oral QHS PRN Sharma Covert, MD   100 mg at 06/29/19 2044  . ziprasidone (GEODON) injection 20 mg  20 mg Intramuscular Q6H PRN Sharma Covert, MD       PTA Medications: No medications prior to admission.    Patient Stressors: Medication change or noncompliance Substance abuse  Patient Strengths: Capable of independent living Agricultural engineer for treatment/growth Physical Health Religious Affiliation Supportive family/friends Work skills  Treatment Modalities: Medication Management, Group therapy, Case management,  1 to  1 session with clinician, Psychoeducation, Recreational therapy.   Physician Treatment Plan for Primary Diagnosis: <principal problem not specified> Long Term Goal(s): Improvement in symptoms so as ready for discharge Improvement in symptoms so as ready for discharge   Short Term Goals: Ability to identify changes in lifestyle to reduce recurrence of condition will improve Ability to verbalize feelings will improve Ability to disclose and discuss suicidal ideas Ability to demonstrate self-control will improve Ability to identify and develop effective coping behaviors will improve Ability to maintain clinical measurements within normal limits will improve Ability to identify triggers associated with substance abuse/mental health issues will improve Ability to identify changes in lifestyle to reduce recurrence of condition will improve Ability to verbalize feelings will improve Ability to disclose and discuss suicidal ideas Ability to demonstrate self-control will improve Ability to identify and develop effective coping behaviors will improve Ability to maintain clinical measurements within normal limits will improve Ability to identify triggers associated with substance abuse/mental health issues will improve  Medication Management: Evaluate patient's response, side effects, and tolerance of medication regimen.  Therapeutic Interventions: 1 to 1 sessions, Unit Group sessions and Medication administration.  Evaluation of Outcomes: Progressing  Physician Treatment Plan for Secondary Diagnosis: Active Problems:   Brief psychotic disorder ([place])   Psychosis ([place])  Long Term Goal(s): Improvement in symptoms so as ready for discharge Improvement in symptoms so as ready for discharge   Short Term Goals: Ability to  identify changes in lifestyle to reduce recurrence of condition will improve Ability to verbalize feelings will improve Ability to disclose and discuss suicidal ideas Ability  to demonstrate self-control will improve Ability to identify and develop effective coping behaviors will improve Ability to maintain clinical measurements within normal limits will improve Ability to identify triggers associated with substance abuse/mental health issues will improve Ability to identify changes in lifestyle to reduce recurrence of condition will improve Ability to verbalize feelings will improve Ability to disclose and discuss suicidal ideas Ability to demonstrate self-control will improve Ability to identify and develop effective coping behaviors will improve Ability to maintain clinical measurements within normal limits will improve Ability to identify triggers associated with substance abuse/mental health issues will improve     Medication Management: Evaluate patient's response, side effects, and tolerance of medication regimen.  Therapeutic Interventions: 1 to 1 sessions, Unit Group sessions and Medication administration.  Evaluation of Outcomes: Progressing   RN Treatment Plan for Primary Diagnosis: <principal problem not specified> Long Term Goal(s): Knowledge of disease and therapeutic regimen to maintain health will improve  Short Term Goals: Ability to verbalize feelings will improve, Ability to identify and develop effective coping behaviors will improve and Compliance with prescribed medications will improve  Medication Management: RN will administer medications as ordered by provider, will assess and evaluate patient's response and provide education to patient for prescribed medication. RN will report any adverse and/or side effects to prescribing provider.  Therapeutic Interventions: 1 on 1 counseling sessions, Psychoeducation, Medication administration, Evaluate responses to treatment, Monitor vital signs and CBGs as ordered, Perform/monitor CIWA, COWS, AIMS and Fall Risk screenings as ordered, Perform wound care treatments as ordered.  Evaluation of  Outcomes: Progressing   LCSW Treatment Plan for Primary Diagnosis: <principal problem not specified> Long Term Goal(s): Safe transition to appropriate next level of care at discharge, Engage patient in therapeutic group addressing interpersonal concerns.  Short Term Goals: Engage patient in aftercare planning with referrals and resources, Increase social support, Increase emotional regulation, Identify triggers associated with mental health/substance abuse issues and Increase skills for wellness and recovery  Therapeutic Interventions: Assess for all discharge needs, 1 to 1 time with Social worker, Explore available resources and support systems, Assess for adequacy in community support network, Educate family and significant other(s) on suicide prevention, Complete Psychosocial Assessment, Interpersonal group therapy.  Evaluation of Outcomes: Progressing  Progress in Treatment: Attending groups: Yes. Participating in groups: Yes. Taking medication as prescribed: Yes. Toleration medication: Yes. Family/Significant other contact made: Yes, individual(s) contacted:  sister, Patient understands diagnosis: Yes. Discussing patient identified problems/goals with staff: Yes. Medical problems stabilized or resolved: Yes. Denies suicidal/homicidal ideation: Yes. Issues/concerns per patient self-inventory: Yes.  New problem(s) identified: Yes, Describe:  family conflict, financial stressors  New Short Term/Long Term Goal(s): medication management for mood stabilization; elimination of SI thoughts; development of comprehensive mental wellness/sobriety plan.  Patient Goals: "Get better."  Discharge Plan or Barriers: Will be referred for outpatient medication management and therapy. Would like referrals for PCP  Reason for Continuation of Hospitalization: Anxiety Delusions  Depression Medication stabilization  Estimated Length of Stay: 3-5 days  Attendees: Patient: Olivia Juarez 07/01/2019  9:08 AM  Physician: Marguerita Merles 07/01/2019 9:08 AM  Nursing:  07/01/2019 9:08 AM  RN Care Manager: 07/01/2019 9:08 AM  Social Worker: Enid Cutter, LCSWA 07/01/2019 9:08 AM  Recreational Therapist:  07/01/2019 9:08 AM  Other:  07/01/2019 9:08 AM  Other:  07/01/2019 9:08 AM  Other: 07/01/2019 9:08 AM  Scribe for Treatment Team: Darreld Mclean, Theresia Majors 07/01/2019 9:08 AM

## 2019-07-01 NOTE — [facility] Group Notes (Signed)
LCSW Aftercare Discharge Planning Group Note  07/01/2019 1:30pm   Type of Group and Topic: Psychoeducational Group: Discharge Planning  Participation Level: Active  Description of Group: Discharge planning group reviews patient's anticipated discharge plans and assists patients to anticipate and address any barriers to wellness/recovery in the community. Suicide prevention education is reviewed with patients in group.  Therapeutic Goals:  1. Patients will state their anticipated discharge plan and mental health aftercare  2. Patients will identify potential barriers to wellness in the community setting  3. Patients will engage in problem solving, solution focused discussion of ways to anticipate and address barriers to wellness/recovery  Summary of Patient Progress: Anyi joined shortly after the start of group. Johnda appropriately participated in discussion and respectfully agreed with other participants discussion.   Plan for Discharge/Comments: Plans to return to live with her sister at discharge. Teeghan plans to continue with outpatient medication management and therapy after discharged from the hospital.   Transportation Means: Not discussed  Supports: Fidela was able to voice her concerns with her current level of support from family and her need for additional support.   Therapeutic Modalities: Motivational Interviewing   Ruthann Cancer MSW, [employer] Clincal Social Worker  [facility]

## 2019-07-01 NOTE — Progress Notes (Signed)
Recreation Therapy Notes ° °Date:  6.2.21 °Time: 1110 °Location: [address] Dayroom ° °Group Topic: Stress Management ° °Goal Area(s) Addresses:  °Patient will identify positive stress management techniques. °Patient will identify benefits of using stress management post d/c. ° °Intervention: Stress Management ° °Activity:  Breathing Techniques and Meditation.  LRT read a script to guide patients through the proper breathing techniques.  LRT then played a meditation that focused on letting go of the past and focusing on the present.  ° °Education:  Stress Management, Discharge Planning.  ° °Education Outcome: Acknowledges Education ° °Clinical Observations/Feedback: Pt did not attend group session. ° ° ° ° °Nabilah Davoli, LRT/CTRS ° ° ° ° ° ° ° ° °Vegas Coffin A °07/01/2019 12:38 PM °

## 2019-07-01 NOTE — [facility] Counselor (Signed)
CSW scheduled a primary care appointment for patient through [facility].  Enid Cutter, MSW, LCSW-A Clinical Social Worker [facility]

## 2019-07-01 NOTE — Progress Notes (Signed)
Pt visible on the unit this evening, pt stated she was doing better. Pt continues to be paranoid    07/01/19 2000  Psych Admission Type (Psych Patients Only)  Admission Status Involuntary  Psychosocial Assessment  Patient Complaints Anxiety;Suspiciousness  Eye Contact Fair;Suspiciousness  Facial Expression Anxious;Pensive;Worried  Affect Anxious;Appropriate to circumstance  Speech Logical/coherent;Soft  Interaction Avoidant;Cautious;Childlike;Forwards little;Guarded;Minimal  Motor Activity Slow  Appearance/Hygiene Unremarkable  Behavior Characteristics Cooperative;Anxious  Mood Preoccupied;Suspicious;Anxious  Thought Process  Coherency Circumstantial  Content Blaming others  Delusions None reported or observed  Perception WDL  Hallucination None reported or observed  Judgment Poor  Confusion None  Danger to Self  Current suicidal ideation? Denies  Danger to Others  Danger to Others None reported or observed

## 2019-07-01 NOTE — Progress Notes (Signed)
[facility] MD Progress Note  07/01/2019 12:17 PM Olivia Juarez  MRN:  [id number] Subjective: Patient is a 19 year old female with a past psychiatric history significant for possible attention deficit hyperactivity disorder in the past and recent onset auditory hallucinations and marijuana abuse.  Objective: Patient is seen and examined.  Patient is a 19 year old female with the above-stated past psychiatric history is seen in follow-up.  She is fairly sedated this morning but more easily arousable.  She denied any auditory or visual hallucinations.  Most of her complaints this morning centered on somatic complaints such as left-sided jaw pain and constipation.  She stated that she had last used marijuana prior to getting her job insecurity.  We discussed the fact that her drug screen was positive.  Otherwise she denied all complaints today.  Nursing notes reflect that she got 5.25 hours of sleep last night, but was back in bed when I saw her but easily arousable.  Review of her laboratories revealed essentially normal electrolytes, normal lipids, normal CBC.  Beta-hCG was negative.  TSH was normal.  Beta-hCG was negative.  Drug screen was positive for marijuana.  Principal Problem: <principal problem not specified> Diagnosis: Active Problems:   Brief psychotic disorder (HCC)   Psychosis ([place])  Total Time spent with patient: 15 minutes  Past Psychiatric History: See admission H&P  Past Medical History: History reviewed. No pertinent past medical history. History reviewed. No pertinent surgical history. Family History: History reviewed. No pertinent family history. Family Psychiatric  History: See admission H&P Social History:  Social History   Substance and Sexual Activity  Alcohol Use Not Currently  . Alcohol/week: 1.0 standard drinks  . Types: 1 Shots of liquor per week   Comment: Last "took a shot like 3 weeks ago"     Social History   Substance and Sexual Activity  Drug Use Yes  . Types:  Marijuana   Comment: Last use "Last week"    Social History   Socioeconomic History  . Marital status: Single    Spouse name: Not on file  . Number of children: Not on file  . Years of education: Not on file  . Highest education level: Not on file  Occupational History  . Not on file  Tobacco Use  . Smoking status: Never Smoker  . Smokeless tobacco: Never Used  Substance and Sexual Activity  . Alcohol use: Not Currently    Alcohol/week: 1.0 standard drinks    Types: 1 Shots of liquor per week    Comment: Last "took a shot like 3 weeks ago"  . Drug use: Yes    Types: Marijuana    Comment: Last use "Last week"  . Sexual activity: Yes  Other Topics Concern  . Not on file  Social History Narrative  . Not on file   Social Determinants of Health   Financial Resource Strain:   . Difficulty of Paying Living Expenses:   Food Insecurity:   . Worried About Charity fundraiser in the Last Year:   . Arboriculturist in the Last Year:   Transportation Needs:   . Film/video editor (Medical):   Marland Kitchen Lack of Transportation (Non-Medical):   Physical Activity:   . Days of Exercise per Week:   . Minutes of Exercise per Session:   Stress:   . Feeling of Stress :   Social Connections:   . Frequency of Communication with Friends and Family:   . Frequency of Social Gatherings with Friends and  Family:   . Attends Religious Services:   . Active Member of Clubs or Organizations:   . Attends Banker Meetings:   Marland Kitchen Marital Status:    Additional Social History:                         Sleep: Fair  Appetite:  Fair  Current Medications: Current Facility-Administered Medications  Medication Dose Route Frequency Provider Last Rate Last Admin  . acetaminophen (TYLENOL) tablet 650 mg  650 mg Oral Q6H PRN Antonieta Pert, MD   650 mg at 06/30/19 1847  . alum & mag hydroxide-simeth (MAALOX/MYLANTA) 200-200-20 MG/5ML suspension 30 mL  30 mL Oral Q4H PRN Antonieta Pert, MD      . bisacodyl (DULCOLAX) suppository 10 mg  10 mg Rectal Daily PRN Antonieta Pert, MD      . hydrOXYzine (ATARAX/VISTARIL) tablet 25 mg  25 mg Oral TID PRN Antonieta Pert, MD   25 mg at 06/30/19 2110  . OLANZapine zydis (ZYPREXA) disintegrating tablet 10 mg  10 mg Oral Q8H PRN Antonieta Pert, MD       And  . LORazepam (ATIVAN) tablet 2 mg  2 mg Oral PRN Antonieta Pert, MD      . magnesium citrate solution 1 Bottle  1 Bottle Oral Once PRN Antonieta Pert, MD      . magnesium hydroxide (MILK OF MAGNESIA) suspension 30 mL  30 mL Oral Daily PRN Antonieta Pert, MD   30 mL at 06/30/19 1921  . OLANZapine zydis (ZYPREXA) disintegrating tablet 20 mg  20 mg Oral QHS Antonieta Pert, MD   20 mg at 06/30/19 2110  . traZODone (DESYREL) tablet 100 mg  100 mg Oral QHS PRN Antonieta Pert, MD   100 mg at 06/29/19 2044  . ziprasidone (GEODON) injection 20 mg  20 mg Intramuscular Q6H PRN Antonieta Pert, MD        Lab Results: No results found for this or any previous visit (from the past 48 hour(s)).  Blood Alcohol level:  Lab Results  Component Value Date   ETH <10 06/28/2019    Metabolic Disorder Labs: No results found for: HGBA1C, MPG No results found for: PROLACTIN Lab Results  Component Value Date   CHOL 158 06/29/2019   TRIG 49 06/29/2019   HDL 59 06/29/2019   CHOLHDL 2.7 06/29/2019   VLDL 10 06/29/2019   LDLCALC 89 06/29/2019    Physical Findings: AIMS: Facial and Oral Movements Muscles of Facial Expression: None, normal Lips and Perioral Area: None, normal Jaw: None, normal Tongue: None, normal,Extremity Movements Upper (arms, wrists, hands, fingers): None, normal Lower (legs, knees, ankles, toes): None, normal, Trunk Movements Neck, shoulders, hips: None, normal, Overall Severity Severity of abnormal movements (highest score from questions above): None, normal Incapacitation due to abnormal movements: None, normal Patient's awareness  of abnormal movements (rate only patient's report): No Awareness, Dental Status Current problems with teeth and/or dentures?: No Does patient usually wear dentures?: No  CIWA:    COWS:     Musculoskeletal: Strength & Muscle Tone: within normal limits Gait & Station: normal Patient leans: N/A  Psychiatric Specialty Exam: Physical Exam  Nursing note and vitals reviewed. Constitutional: She is oriented to person, place, and time. She appears well-developed and well-nourished.  HENT:  Head: Normocephalic and atraumatic.  Respiratory: Effort normal.  Neurological: She is alert and oriented to person, place, and time.  Review of Systems  Blood pressure 120/75, pulse (!) 126, temperature 98.6 F (37 C), temperature source Oral, resp. rate 18, height 5\' 2"  (1.575 m), weight 69.4 kg, SpO2 100 %.Body mass index is 27.98 kg/m.  General Appearance: Disheveled  Eye Contact:  Fair  Speech:  Normal Rate  Volume:  Decreased  Mood:  Sedated  Affect:  Congruent  Thought Process:  Coherent and Descriptions of Associations: Intact  Orientation:  Full (Time, Place, and Person)  Thought Content:  Logical  Suicidal Thoughts:  No  Homicidal Thoughts:  No  Memory:  Immediate;   Fair Recent;   Fair Remote;   Fair  Judgement:  Intact  Insight:  Fair  Psychomotor Activity:  Decreased  Concentration:  Concentration: Fair and Attention Span: Fair  Recall:  of Knowledge:  Fair  Language:  Fair  Akathisia:  Negative  Handed:  Right  AIMS (if indicated):     Assets:  Desire for Improvement Resilience  ADL's:  Intact  Cognition:  WNL  Sleep:  Number of Hours: 5.25     Treatment Plan Summary: Daily contact with patient to assess and evaluate symptoms and progress in treatment, Medication management and Plan : Patient is seen and examined.  Patient is a 19 year old female with the above-stated past psychiatric history was seen in follow-up.   Diagnosis: 1.  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 2.  Cannabis use disorder 3.  Constipation  Findings on examination today: 1.  Patient denied any active auditory or visual hallucinations. 2.  She continued to complain of constipation.  Plan: 1.  Dulcolax suppository 10 mg daily as needed constipation. 2.  Continue hydroxyzine 25 mg p.o. 3 times daily as needed anxiety. 3.  Magnesium citrate 1 bottle x1 for constipation. 4.  Change Zyprexa Zydis to 20 mg p.o. nightly and stop daytime dose.  This is for psychosis. 5.  Continue trazodone 100 mg p.o. nightly as needed insomnia. 6.  Disposition planning-in progress.  24, MD 07/01/2019, 12:17 PM

## 2019-07-01 NOTE — Progress Notes (Signed)
The patient's positive event for the day is that she felt calmer and ate better as well. She also mentioned that her anxiety is "in check". Her goal for tomorrow is to get discharged and to eat better.

## 2019-07-02 LAB — RPR: RPR Ser Ql: NONREACTIVE

## 2019-07-02 MED ORDER — TRAZODONE HCL 100 MG PO TABS: 100.0000 mg | ORAL_TABLET | Freq: Every evening | ORAL | 0 refills | Status: DC | PRN

## 2019-07-02 MED ORDER — HYDROXYZINE HCL 25 MG PO TABS: 25.0000 mg | ORAL_TABLET | Freq: Three times a day (TID) | ORAL | 0 refills | Status: DC | PRN

## 2019-07-02 MED ORDER — OLANZAPINE 20 MG PO TBDP: 20.0000 mg | ORAL_TABLET | Freq: Every day | ORAL | 0 refills | Status: DC

## 2019-07-02 MED ORDER — METRONIDAZOLE 500 MG PO TABS: 500.0000 mg | ORAL_TABLET | Freq: Two times a day (BID) | ORAL | Status: AC

## 2019-07-02 MED ORDER — OLANZAPINE 2.5 MG PO TABS
2.5000 mg | ORAL_TABLET | ORAL | Status: AC
  Administered 2019-07-02: 2.5 mg via ORAL
  Filled 2019-07-02 (×2): qty 1

## 2019-07-02 MED ORDER — HYDROCORTISONE (PERIANAL) 2.5 % EX CREA: TOPICAL_CREAM | Freq: Three times a day (TID) | CUTANEOUS | 0 refills | Status: DC

## 2019-07-02 NOTE — Progress Notes (Signed)
Recreation Therapy Notes  Date: 6.3.21 Time: 1000 Location:  [address] Dayroom  Group Topic: Communication, Team Building, Problem Solving  Goal Area(s) Addresses:  Patient will effectively work with peer towards shared goal.  Patient will identify skills used to make activity successful.  Patient will identify how skills used during activity can be used to reach post d/c goals.   Behavioral Response: Engaged  Intervention: STEM Activity  Activity: Stage manager. In teams patients were given 12 plastic drinking straws and a length of masking tape. Using the materials provided patients were asked to build a landing pad to catch a golf ball dropped from approximately 6 feet in the air.   Education: Pharmacist, community, Discharge Planning   Education Outcome: Acknowledges education/In group clarification offered/Needs additional education.   Clinical Observations/Feedback: Pt engaged and active during group session.  Pt worked well with peer in coming up with a concept to complete the task.  Pt was attentive to what she and her partner were doing.  Pt and partner were successful in completing the task.    Caroll Rancher, LRT/CTRS    Lillia Abed, Pammie Chirino A 07/02/2019 11:11 AM

## 2019-07-02 NOTE — [facility] Suicide Risk Assessment (Signed)
[facility] Discharge Suicide Risk Assessment   Principal Problem: <principal problem not specified> Discharge Diagnoses: Active Problems:   Brief psychotic disorder (HCC)   Psychosis (HCC)   Total Time spent with patient: 20 minutes  Musculoskeletal: Strength & Muscle Tone: within normal limits Gait & Station: normal Patient leans: N/A  Psychiatric Specialty Exam: Review of Systems  All other systems reviewed and are negative.   Blood pressure 126/83, pulse (!) 104, temperature 99.3 F (37.4 C), temperature source Oral, resp. rate 18, height 5\' 2"  (1.575 m), weight 69.4 kg, SpO2 100 %.Body mass index is 27.98 kg/m.  General Appearance: Casual  Eye Contact::  Good  Speech:  Normal Rate409  Volume:  Normal  Mood:  Anxious  Affect:  Congruent  Thought Process:  Coherent and Descriptions of Associations: Intact  Orientation:  Full (Time, Place, and Person)  Thought Content:  Logical  Suicidal Thoughts:  No  Homicidal Thoughts:  No  Memory:  Immediate;   Good Recent;   Good Remote;   Good  Judgement:  Intact  Insight:  Fair  Psychomotor Activity:  Normal  Concentration:  Good  Recall:  Good  Fund of Knowledge:Good  Language: Good  Akathisia:  Negative  Handed:  Right  AIMS (if indicated):     Assets:  Desire for Improvement Housing Resilience Social Support  Sleep:  Number of Hours: 6.75  Cognition: WNL  ADL's:  Intact   Mental Status Per Nursing Assessment::   On Admission:  NA  Demographic Factors:  Low socioeconomic status  Loss Factors: NA  Historical Factors: Impulsivity  Risk Reduction Factors:   Sense of responsibility to family, Living with another person, especially a relative and Positive social support  Continued Clinical Symptoms:  More than one psychiatric diagnosis  Cognitive Features That Contribute To Risk:  None    Suicide Risk:  Mild:  Suicidal ideation of limited frequency, intensity, duration, and specificity.  There are no  identifiable plans, no associated intent, mild dysphoria and related symptoms, good self-control (both objective and subjective assessment), few other risk factors, and identifiable protective factors, including available and accessible social support.  Follow-up Information    [facility] Follow up on 07/16/2019.   Specialty: Family Medicine Why: Your primary care appointment is scheduled for 07/16/19 at 1:50pm. Your appointment will be held virtually by phone and the provider will call you. Contact information: 07/18/19 [place] [phone]       [facility]. Call on 07/07/2019.   Specialty: Behavioral Health Why: You have appointments on 07/07/19 at 9 am for therapy, and on 07/08/19 at 1:00 pm for medication management.  These are Virtual appointments.  Please call to confirm. Contact information: [address] [place] [phone]          Plan Of Care/Follow-up recommendations:  Activity:  ad lib  [phone], MD 07/02/2019, 9:10 AM

## 2019-07-02 NOTE — Progress Notes (Signed)
Pt discharged to lobby. Pt was stable and appreciative at that time. All papers, samples and prescriptions were given and valuables returned. Verbal understanding expressed. Denies SI/HI and A/VH. Pt given opportunity to express concerns and ask questions.  

## 2019-07-02 NOTE — Discharge Summary (Signed)
Physician Discharge Summary Note  Patient:  Olivia Juarez is an 19 y.o., female  MRN:  [id number]  DOB:  April 01, 2000  Patient phone:  [phone] (home)   Patient address:   [address] [place] [address],   Total Time spent with patient: Greater than 30 minutes  Date of Admission:  06/29/2019  Date of Discharge: 07-02-19  Reason for Admission: Worsening paranoia & delusional thinking.  Principal Problem: Brief psychotic disorder [facility])  Discharge Diagnoses: Principal Problem:   Brief psychotic disorder ([place]) Active Problems:   Psychosis ([place])  Past Psychiatric History: Brief psychotic disorder  Past Medical History: History reviewed. No pertinent past medical history. History reviewed. No pertinent surgical history.  Family History: History reviewed. No pertinent family history.  Family Psychiatric  History: See H&P  Social History:  Social History   Substance and Sexual Activity  Alcohol Use Not Currently   Alcohol/week: 1.0 standard drinks   Types: 1 Shots of liquor per week   Comment: Last "took a shot like 3 weeks ago"     Social History   Substance and Sexual Activity  Drug Use Yes   Types: Marijuana   Comment: Last use "Last week"    Social History   Socioeconomic History   Marital status: Single    Spouse name: Not on file   Number of children: Not on file   Years of education: Not on file   Highest education level: Not on file  Occupational History   Not on file  Tobacco Use   Smoking status: Never Smoker   Smokeless tobacco: Never Used  Substance and Sexual Activity   Alcohol use: Not Currently    Alcohol/week: 1.0 standard drinks    Types: 1 Shots of liquor per week    Comment: Last "took a shot like 3 weeks ago"   Drug use: Yes    Types: Marijuana    Comment: Last use "Last week"   Sexual activity: Yes  Other Topics Concern   Not on file  Social History Narrative   Not on file   Social Determinants  of Health   Financial Resource Strain:    Difficulty of Paying Living Expenses:   Food Insecurity:    Worried About Charity fundraiser in the Last Year:    Arboriculturist in the Last Year:   Transportation Needs:    Film/video editor (Medical):    Lack of Transportation (Non-Medical):   Physical Activity:    Days of Exercise per Week:    Minutes of Exercise per Session:   Stress:    Feeling of Stress :   Social Connections:    Frequency of Communication with Friends and Family:    Frequency of Social Gatherings with Friends and Family:    Attends Religious Services:    Active Member of Clubs or Organizations:    Attends Archivist Meetings:    Marital Status:    Hospital Course: (Per Md's admission evaluation notes): Patient is a 19 year old female with a past psychiatric history significant for possible attention deficit hyperactivity disorder who presented to the [facility] emergency department on 06/28/2019 after being brought there by her sister. The patient moved from [place] approximately 3 weeks prior to admission. She was going to live with her sister. The patient stated this a.m. that she had moved from [place] to get away from her mother. She stated that she and her mother with fight for unspecified  reasons. According to the notes in the emergency room sister reported that things have not gone well since she had moved here. The patient had reportedly thought that the neighbors were looking through their blinds, that the neighbors were recording her, and that her sisters bones were tapped. The patient is significantly sedated this morning. She was unable to sleep at all last night. She stated that she was hearing voices currently, but the voices were in her head. She stated that the voices started approximately 3 weeks ago. She denied any alcohol or drug use outside of marijuana use. She stated the last time she  used marijuana was approximately 3 weeks ago. She had been previously prescribed Vyvanse several years ago according to the PMP database. Her drug screen was negative except for marijuana. The notes from the emergency room stated that she had smoked marijuana on 5/27. He denies any suicidal or homicidal ideation. She was admitted to the hospital for evaluation and stabilization.  After evaluation of her presenting symptoms, Olivia Juarez was recommended for mood stabilization treatments. The medication regimen for her presenting symptoms were discussed & initiated. She received, stabilized & was discharged on the medications as listed below on her discharge medication lists. She was also enrolled & participated in the group counseling sessions being offered & held on this unit. She learned coping skills. She presented on this admission, no other pre-existing medical conditions that required treatment & monitoring. She tolerated her treatment regimen without any adverse effects or reactions reported.   Olivia Juarez's symptoms responded well to her treatment regimen. Her symptoms has subsided & mood stable. This is evidenced by her daily reports of improved symptoms & absence of psychosis. Patient has met the maximum benefit of her hospitalization at this time. She is currently mentally & medically stable to continue mental health care & medication management on an outpatient basis as noted below. She is provided with all the necessary information needed to make this appointment without problems. Upon discharge, Olivia Juarez adamantly denies any suicidal/homicidal ideations, auditory/visual hallucinations delusional thinking, paranoia or substance withdrawal symptoms. During the course of her hospitalization, the 15-minute checks were adequate to ensure Olivia Juarez's safety. Patient did not display any dangerous, violent or suicidal behavior on the unit.  She interacted with the other patients & staff appropriately, participated  appropriately in the group sessions/therapies. Her medications were addressed & adjusted to meet her needs. She was recommended for outpatient follow-up care & medication management upon discharge to assure her continuity of care.  At the time of discharge patient is not reporting any acute suicidal/homicidal ideations. She feels more confident about her self & mental health care. She currently denies any new issues or concerns. Education and supportive counseling provided throughout her hospital stay & upon discharge.   Today upon her discharge evaluation with the attending psychiatrist, Anabell shares she is doing well. She denies any other specific concerns. She is sleeping well. Her appetite is good. She denies other physical complaints. She denies AH/VH. She feels that her medications have been helpful & is in agreement to continue her current treatment regimen as recommended. She was able to engage in safety planning including plan to return to [facility] or contact emergency services if she feels unable to maintain her own safety or the safety of others. Pt had no further questions, comments, or concerns. She left [facility] with all personal belongings in no apparent distress. Transportation per her sister.  Physical Findings: AIMS: Facial and Oral Movements Muscles of Facial Expression: None,  normal Lips and Perioral Area: None, normal Jaw: None, normal Tongue: None, normal,Extremity Movements Upper (arms, wrists, hands, fingers): None, normal Lower (legs, knees, ankles, toes): None, normal, Trunk Movements Neck, shoulders, hips: None, normal, Overall Severity Severity of abnormal movements (highest score from questions above): None, normal Incapacitation due to abnormal movements: None, normal Patient's awareness of abnormal movements (rate only patient's report): No Awareness, Dental Status Current problems with teeth and/or dentures?: No Does patient usually wear dentures?: No  CIWA:    COWS:      Musculoskeletal: Strength & Muscle Tone: within normal limits Gait & Station: normal Patient leans: N/A  Psychiatric Specialty Exam: Physical Exam  Nursing note and vitals reviewed. Constitutional: She is oriented to person, place, and time. She appears well-developed.  HENT:  Head: Normocephalic.  Eyes: Pupils are equal, round, and reactive to light.  Cardiovascular: Normal rate.  Respiratory: Effort normal.  Genitourinary:    Genitourinary Comments: Deferred   Musculoskeletal:        General: Normal range of motion.     Cervical back: Normal range of motion.  Neurological: She is alert and oriented to person, place, and time.  Skin: Skin is warm and dry.    Review of Systems  Constitutional: Negative for chills, diaphoresis and fever.  HENT: Negative for congestion, sneezing and sore throat.   Eyes: Negative for discharge.  Respiratory: Negative for cough, chest tightness, shortness of breath and wheezing.   Cardiovascular: Negative for chest pain and palpitations.  Gastrointestinal: Negative for diarrhea, nausea and vomiting.  Endocrine: Negative for cold intolerance and heat intolerance.  Genitourinary: Negative for difficulty urinating.  Musculoskeletal: Negative for arthralgias and myalgias.  Skin: Negative.   Allergic/Immunologic: Negative for environmental allergies and food allergies.       Allergies: Latex  Neurological: Negative for dizziness, tremors, seizures, syncope, speech difficulty, weakness, light-headedness, numbness and headaches.  Psychiatric/Behavioral: Positive for dysphoric mood (Stabilized with medication prior to discharge), hallucinations (Hx of psychosis (Stabilized with medication prior to discharge) and sleep disturbance (Stabilized with medication prior to discharge). Negative for agitation, behavioral problems, confusion, decreased concentration, self-injury and suicidal ideas. The patient is not nervous/anxious and is not hyperactive.      Blood pressure 126/83, pulse (!) 104, temperature 99.3 F (37.4 C), temperature source Oral, resp. rate 18, height 5' 2"  (1.575 m), weight 69.4 kg, SpO2 100 %.Body mass index is 27.98 kg/m.  See Md's discharge SRA  Sleep:  Number of Hours: 6.75   Has this patient used any form of tobacco in the last 30 days? (Cigarettes, Smokeless Tobacco, Cigars, and/or Pipes): N/A  Blood Alcohol level:  Lab Results  Component Value Date   ETH <10 30/07/6224   Metabolic Disorder Labs:  No results found for: HGBA1C, MPG No results found for: PROLACTIN Lab Results  Component Value Date   CHOL 158 06/29/2019   TRIG 49 06/29/2019   HDL 59 06/29/2019   CHOLHDL 2.7 06/29/2019   VLDL 10 06/29/2019   [place] 89 06/29/2019   See Psychiatric Specialty Exam and Suicide Risk Assessment completed by Attending Physician prior to discharge.  Discharge destination:  Home  Is patient on multiple antipsychotic therapies at discharge:  No   Has Patient had three or more failed trials of antipsychotic monotherapy by history:  No  Recommended Plan for Multiple Antipsychotic Therapies: NA  Allergies as of 07/02/2019      Reactions   Latex    Pt endorses nkda, Byron has this listed from  [id number]       Medication List    TAKE these medications     Indication  hydrocortisone 2.5 % rectal cream Commonly known as: ANUSOL-HC Place rectally 3 (three) times daily. For hemorrhoid issues  Indication: Inflamed Hemorrhoids   hydrOXYzine 25 MG tablet Commonly known as: ATARAX/VISTARIL Take 1 tablet (25 mg total) by mouth 3 (three) times daily as needed for anxiety.  Indication: Feeling Anxious   metroNIDAZOLE 500 MG tablet Commonly known as: FLAGYL Take 1 tablet (500 mg total) by mouth every 12 (twelve) hours. For yeast infection  Indication: Yeast infection   OLANZapine zydis 20 MG disintegrating tablet Commonly known as: ZYPREXA Take 1 tablet (20 mg total) by mouth at bedtime. For mood  control  Indication: Mood control   traZODone 100 MG tablet Commonly known as: DESYREL Take 1 tablet (100 mg total) by mouth at bedtime as needed for sleep.  Indication: Trouble Sleeping      Follow-up Information    [facility] Follow up on 07/16/2019.   Specialty: Family Medicine Why: Your primary care appointment is scheduled for 07/16/19 at 1:50pm. Your appointment will be held virtually by phone and the provider will call you. Contact information: [place] [address] [place]. Call on 07/07/2019.   Specialty: Behavioral Health Why: You have appointments on 07/07/19 at 9 am for therapy, and on 07/08/19 at 1:00 pm for medication management.  These are Virtual appointments.  Please call to confirm. Contact information: [place] [phone]         Follow-up recommendations: Activity:  As tolerated Diet: As recommended by your primary care doctor. Keep all scheduled follow-up appointments as recommended.    Comments: Prescriptions given at discharge.  Patient agreeable to plan.  Given opportunity to ask questions.  Appears to feel comfortable with discharge denies any current suicidal or homicidal thought. Patient is also instructed prior to discharge to: Take all medications as prescribed by his/her mental healthcare provider. Report any adverse effects and or reactions from the medicines to his/her outpatient provider promptly. Patient has been instructed & cautioned: To not engage in alcohol and or illegal drug use while on prescription medicines. In the event of worsening symptoms, patient is instructed to call the crisis hotline, 911 and or go to the nearest ED for appropriate evaluation and treatment of symptoms. To follow-up with his/her primary care provider for your other medical issues, concerns and or health care  needs.  Signed: Lindell Spar, NP, PMHNP, FNP-BC 07/02/2019, 3:00 PM

## 2019-07-02 NOTE — Progress Notes (Signed)
  [facility] Adult Case Management Discharge Plan :  Will you be returning to the same living situation after discharge:  Yes,  to continue living with sister At discharge, do you have transportation home?: Yes,  sister to pick pt up. Do you have the ability to pay for your medications: Yes,  has Medicaid  Release of information consent forms completed and in the chart;  Patient's signature needed at discharge.  Patient to Follow up at: Follow-up Information    [facility] Follow up on 07/16/2019.   Specialty: Family Medicine Why: Your primary care appointment is scheduled for 07/16/19 at 1:50pm. Your appointment will be held virtually by phone and the provider will call you. Contact information: [address] [place] [address] [phone]       [facility]. Call on 07/07/2019.   Specialty: Behavioral Health Why: You have appointments on 07/07/19 at 9 am for therapy, and on 07/08/19 at 1:00 pm for medication management.  These are Virtual appointments.  Please call to confirm. Contact information: [phone] [place] [address] [phone]          Next level of care provider has access to [facility] Link:yes  Safety Planning and Suicide Prevention discussed: Yes,  with sister     Has patient been referred to the Quitline?: Patient refused referral  Patient has been referred for addiction treatment: Pt. refused referral  Otelia Santee, LCSWA 07/02/2019, 10:21 AM

## 2019-07-07 ENCOUNTER — Ambulatory Visit (HOSPITAL_COMMUNITY): Payer: No Typology Code available for payment source | Admitting: Licensed Clinical Social Worker

## 2019-07-08 ENCOUNTER — Ambulatory Visit (HOSPITAL_COMMUNITY): Payer: No Typology Code available for payment source | Admitting: Psychiatry

## 2019-07-16 ENCOUNTER — Other Ambulatory Visit: Payer: Self-pay

## 2019-07-16 ENCOUNTER — Encounter (INDEPENDENT_AMBULATORY_CARE_PROVIDER_SITE_OTHER): Payer: Self-pay | Admitting: Primary Care

## 2019-07-16 ENCOUNTER — Telehealth (INDEPENDENT_AMBULATORY_CARE_PROVIDER_SITE_OTHER): Payer: Medicaid Other | Admitting: Primary Care

## 2019-07-16 DIAGNOSIS — Z7689 Persons encountering health services in other specified circumstances: Secondary | ICD-10-CM

## 2019-07-16 DIAGNOSIS — Z09 Encounter for follow-up examination after completed treatment for conditions other than malignant neoplasm: Secondary | ICD-10-CM

## 2019-07-16 DIAGNOSIS — F23 Brief psychotic disorder: Secondary | ICD-10-CM | POA: Diagnosis not present

## 2019-07-16 MED ORDER — HYDROXYZINE HCL 25 MG PO TABS: 25.0000 mg | ORAL_TABLET | Freq: Three times a day (TID) | ORAL | 0 refills | Status: DC | PRN

## 2019-07-16 MED ORDER — OLANZAPINE 20 MG PO TBDP: 20.0000 mg | ORAL_TABLET | Freq: Every day | ORAL | 0 refills | Status: DC

## 2019-07-16 MED ORDER — HYDROCORTISONE (PERIANAL) 2.5 % EX CREA: TOPICAL_CREAM | Freq: Three times a day (TID) | CUTANEOUS | 0 refills | Status: DC

## 2019-07-16 MED ORDER — HYDROCORTISONE (PERIANAL) 2.5 % EX CREA: TOPICAL_CREAM | Freq: Three times a day (TID) | CUTANEOUS | 0 refills | Status: AC

## 2019-07-16 MED ORDER — TRAZODONE HCL 100 MG PO TABS: 100.0000 mg | ORAL_TABLET | Freq: Every evening | ORAL | 0 refills | Status: DC | PRN

## 2019-07-16 MED ORDER — TRAZODONE HCL 100 MG PO TABS: 100.0000 mg | ORAL_TABLET | Freq: Every evening | ORAL | 0 refills | Status: AC | PRN

## 2019-07-16 MED ORDER — OLANZAPINE 20 MG PO TBDP: 20.0000 mg | ORAL_TABLET | Freq: Every day | ORAL | 0 refills | Status: AC

## 2019-07-16 NOTE — Progress Notes (Signed)
Virtual Visit via Telephone Note  I connected with Olivia Juarez on 07/16/19 at  1:50 PM EDT by telephone and verified that I am speaking with the correct person using two identifiers.   I discussed the limitations, risks, security and privacy concerns of performing an evaluation and management service by telephone and the availability of in person appointments. I also discussed with the patient that there may be a patient responsible charge related to this service. The patient expressed understanding and agreed to proceed.   History of Present Illness: Olivia Juarez is a 19 year old female that is having a tele visit to establish care and hospital discharge.She feels a lot better since discharge from the hospital and the medications she was discharge on she feels like they are helping. Question what causes these thought things out of her control like car not working was the only example she could give me. She denies harm to herself or other, voices or hallucinations.  No past medical history on file.  Current Outpatient Medications on File Prior to Visit  Medication Sig Dispense Refill  . hydrocortisone (ANUSOL-HC) 2.5 % rectal cream Place rectally 3 (three) times daily. For hemorrhoid issues 30 g 0  . hydrOXYzine (ATARAX/VISTARIL) 25 MG tablet Take 1 tablet (25 mg total) by mouth 3 (three) times daily as needed for anxiety. 75 tablet 0  . metroNIDAZOLE (FLAGYL) 500 MG tablet Take 1 tablet (500 mg total) by mouth every 12 (twelve) hours. For yeast infection    . OLANZapine zydis (ZYPREXA) 20 MG disintegrating tablet Take 1 tablet (20 mg total) by mouth at bedtime. For mood control 30 tablet 0  . traZODone (DESYREL) 100 MG tablet Take 1 tablet (100 mg total) by mouth at bedtime as needed for sleep. 30 tablet 0   No current facility-administered medications on file prior to visit.   Observations/Objective: Review of Systems  Psychiatric/Behavioral: Positive for depression and  hallucinations. The patient is nervous/anxious.   All other systems reviewed and are negative.  Assessment and Plan: Olivia Juarez was seen today for new patient (initial visit).  Diagnoses and all orders for this visit:  Encounter to establish care Gwinda Passe, NP-C will be your  (PCP) she is mastered prepared . She is skilled to diagnosed and treat illness. Also able to answer health concern as well as continuing care of varied medical conditions, not limited by cause, organ system, or diagnosis.   Hospital discharge follow-up  Brief psychotic disorder [facility])   Follow Up Instructions:    I discussed the assessment and treatment plan with the patient. The patient was provided an opportunity to ask questions and all were answered. The patient agreed with the plan and demonstrated an understanding of the instructions.   The patient was advised to call back or seek an in-person evaluation if the symptoms worsen or if the condition fails to improve as anticipated.  I provided 16  minutes of non-face-to-face time during this encounter. Reviewing previous encounters, and CT   Grayce Sessions, NP

## 2019-07-16 NOTE — Progress Notes (Signed)
Pt states medication prescribed at ED is helping

## 2019-08-13 ENCOUNTER — Other Ambulatory Visit (INDEPENDENT_AMBULATORY_CARE_PROVIDER_SITE_OTHER): Payer: Self-pay | Admitting: Primary Care

## 2019-08-13 DIAGNOSIS — F23 Brief psychotic disorder: Secondary | ICD-10-CM

## 2019-08-17 ENCOUNTER — Telehealth: Payer: Self-pay | Admitting: Licensed Clinical Social Worker

## 2019-08-17 ENCOUNTER — Other Ambulatory Visit: Payer: Self-pay

## 2019-08-17 ENCOUNTER — Ambulatory Visit: Payer: Medicaid Other | Attending: Internal Medicine | Admitting: Licensed Clinical Social Worker

## 2019-08-17 NOTE — Telephone Encounter (Signed)
Call placed to patient regarding scheduled [facility] appointment. LCSW was unable to leave a message due to voicemail being full.  

## 2021-07-15 IMAGING — CT CT HEAD W/O CM
3 series · 15 of 46 positions shown, 18 images · non-contrast
Comparison: None.

CLINICAL DATA: Psychosis

EXAM:
CT HEAD WITHOUT CONTRAST
TECHNIQUE: Contiguous axial images were obtained from the base of the skull
through the vertex without intravenous contrast.

[Series 2: head wo · axial · 0.43mm/px · z∈[-132,-12]mm · 9 of 29 slices shown, 12 images]
[im 3/29  brain]
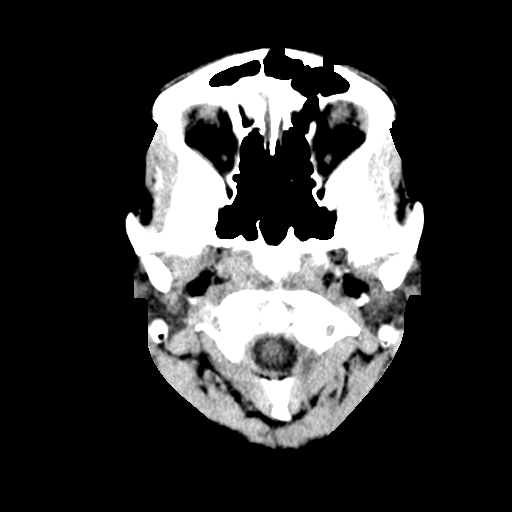
[im 3/29  bone]
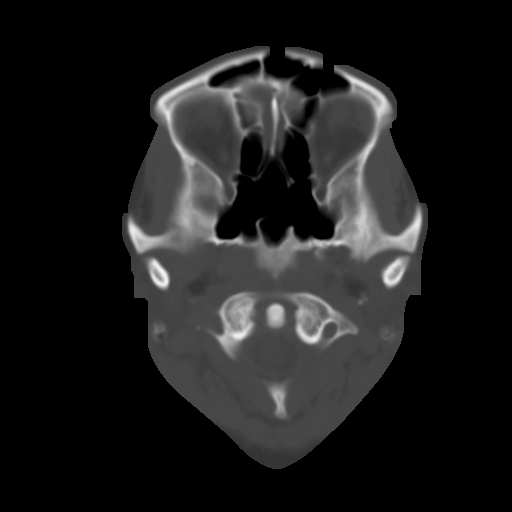
[im 6/29  brain]
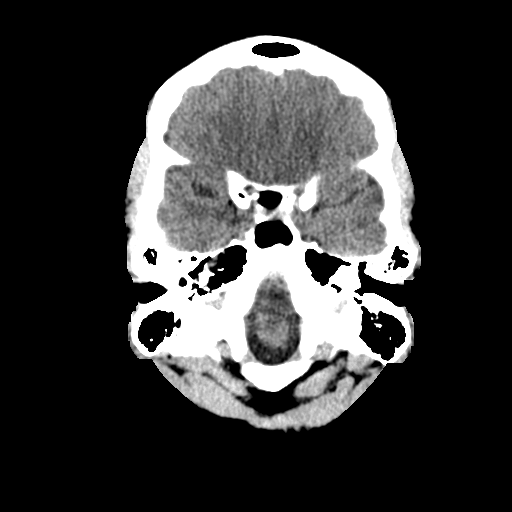
[im 9/29  brain]
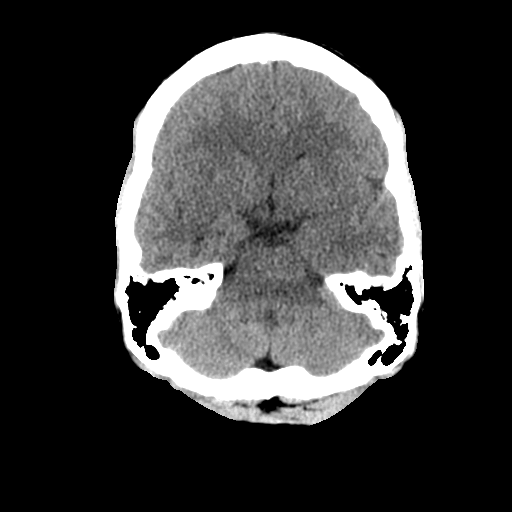
[im 12/29  brain]
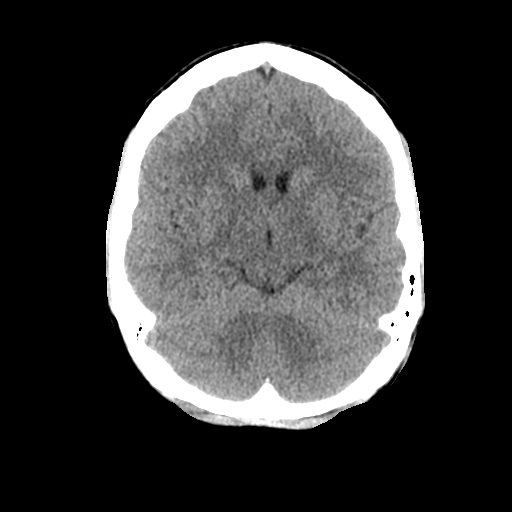
[im 15/29  brain]
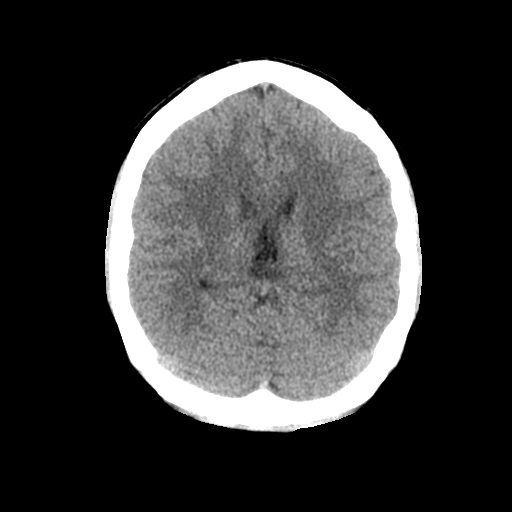
[im 15/29  bone]
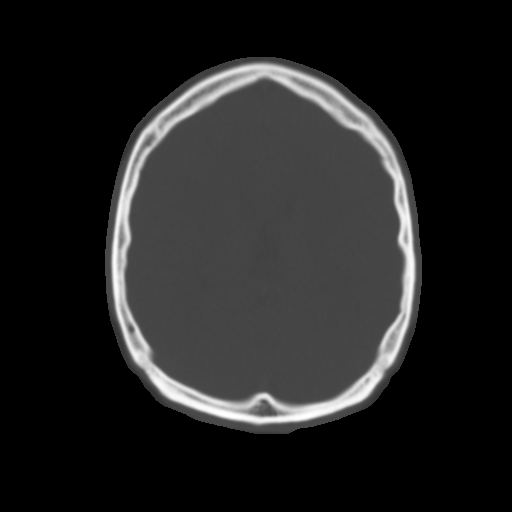
[im 18/29  brain]
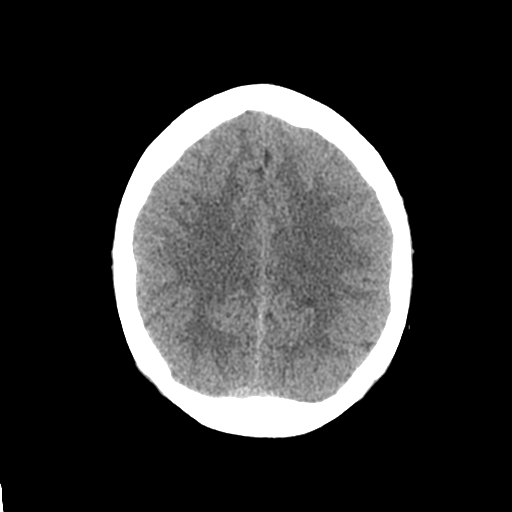
[im 21/29  brain]
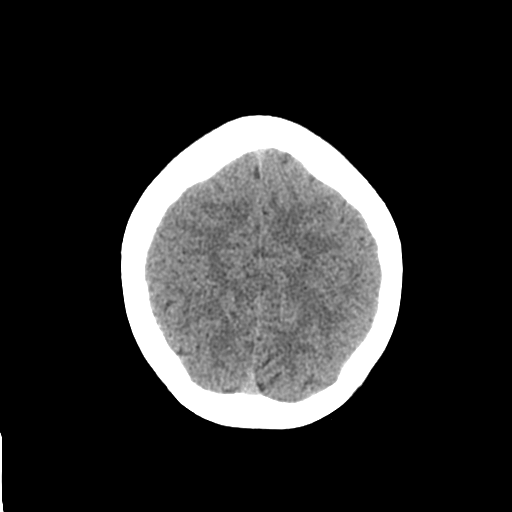
[im 24/29  brain]
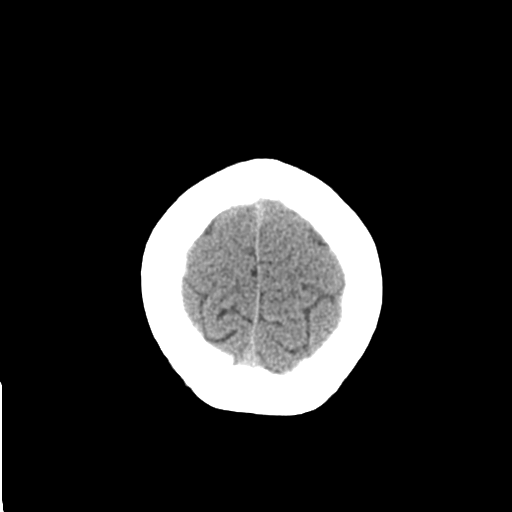
[im 27/29  brain]
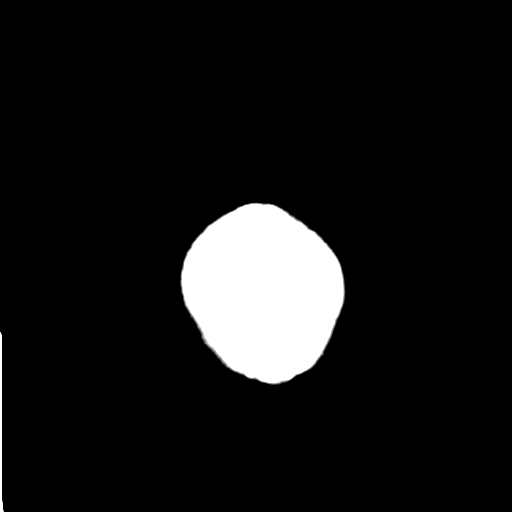
[im 27/29  bone]
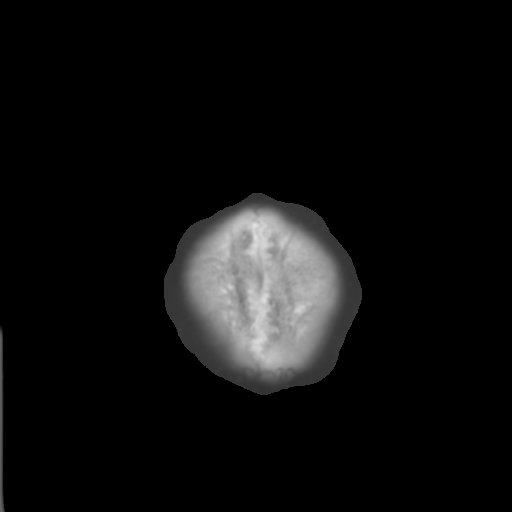

[Series 4: coronal soft tissue · coronal · 0.31mm/px · 3 of 69 slices shown]
[im 23/69  brain]
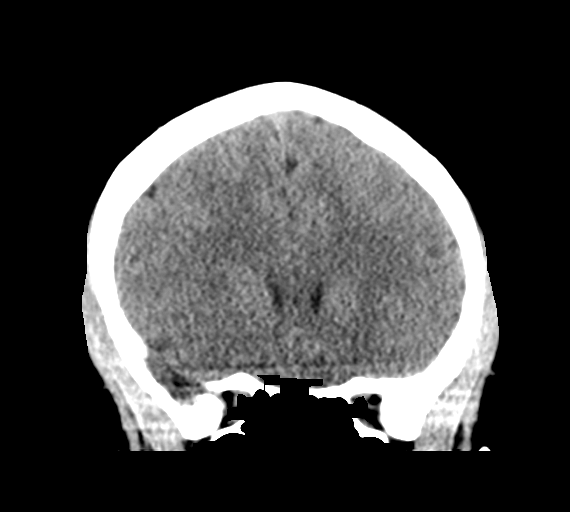
[im 31/69  brain]
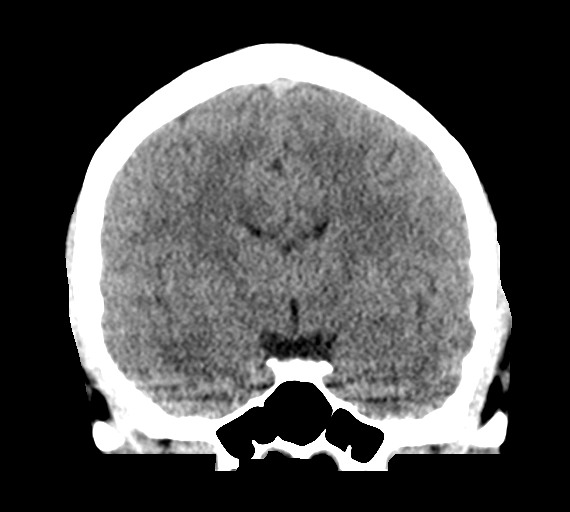
[im 38/69  brain]
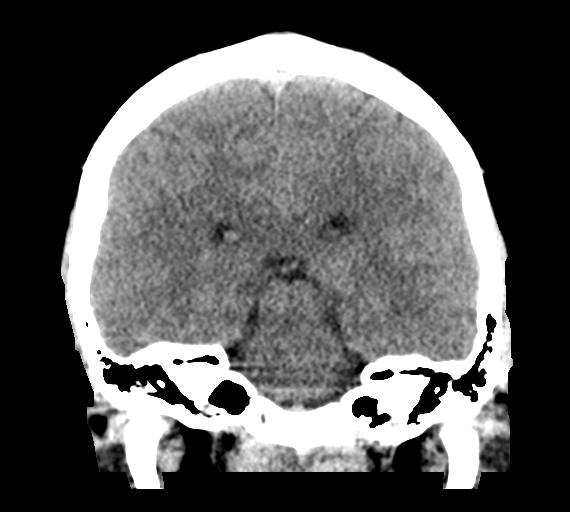

[Series 5: sagittal soft tissue · sagittal · 0.32mm/px · 3 of 52 slices shown]
[im 18/52  brain]
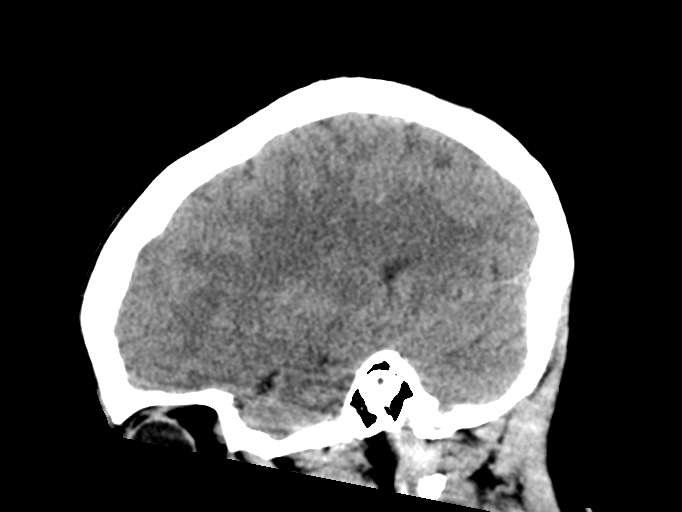
[im 26/52  brain]
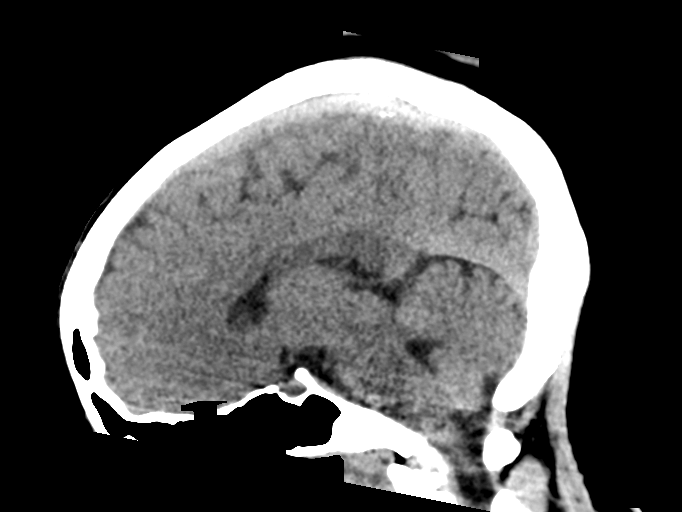
[im 35/52  brain]
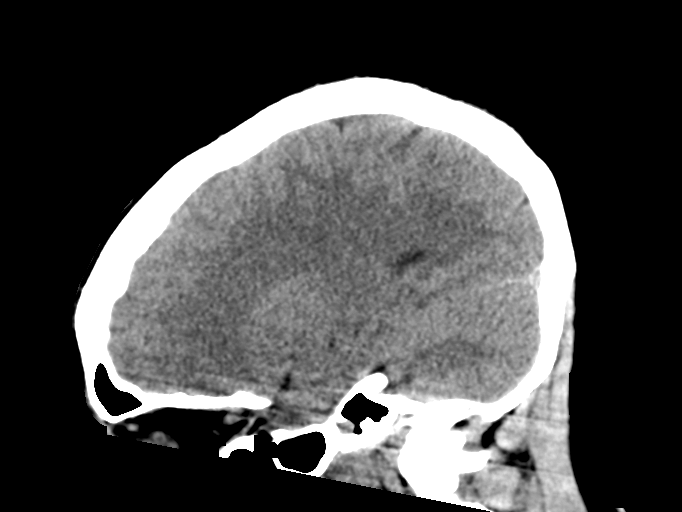

[15 of 46 positions shown; findings below may reference images not displayed]

FINDINGS: Brain: No evidence of acute infarction, hemorrhage, hydrocephalus,
extra-axial collection or mass lesion/mass effect.

Vascular: No hyperdense vessel or unexpected calcification.

Skull: No osseous abnormality.

Sinuses/Orbits: Visualized paranasal sinuses are clear. Visualized
mastoid sinuses are clear. Visualized orbits demonstrate no focal
abnormality.

Other: None
IMPRESSION: No acute intracranial pathology.
